# Patient Record
Sex: Female | Born: 1975 | Race: White | Hispanic: No | Marital: Married
Health system: Southern US, Community
[De-identification: ages and names within clinical notes are randomized; demographics above are authoritative.]

## PROBLEM LIST (undated history)

## (undated) DIAGNOSIS — Q969 Turner's syndrome, unspecified: Secondary | ICD-10-CM

## (undated) DIAGNOSIS — H8109 Meniere's disease, unspecified ear: Secondary | ICD-10-CM

---

## 2015-08-27 ENCOUNTER — Encounter (HOSPITAL_COMMUNITY): Payer: Self-pay | Admitting: Emergency Medicine

## 2015-08-27 ENCOUNTER — Emergency Department (HOSPITAL_COMMUNITY)
Admission: EM | Admit: 2015-08-27 | Discharge: 2015-08-27 | Disposition: A | Discharge status: Home / Self Care | Payer: Worker's Compensation | Attending: Emergency Medicine | Admitting: Emergency Medicine

## 2015-08-27 DIAGNOSIS — Y9289 Other specified places as the place of occurrence of the external cause: Secondary | ICD-10-CM | POA: Insufficient documentation

## 2015-08-27 DIAGNOSIS — S0990XA Unspecified injury of head, initial encounter: Secondary | ICD-10-CM | POA: Diagnosis present

## 2015-08-27 DIAGNOSIS — Y998 Other external cause status: Secondary | ICD-10-CM | POA: Diagnosis not present

## 2015-08-27 DIAGNOSIS — W000XXA Fall on same level due to ice and snow, initial encounter: Secondary | ICD-10-CM | POA: Insufficient documentation

## 2015-08-27 DIAGNOSIS — Q969 Turner's syndrome, unspecified: Secondary | ICD-10-CM | POA: Insufficient documentation

## 2015-08-27 DIAGNOSIS — Y9302 Activity, running: Secondary | ICD-10-CM | POA: Insufficient documentation

## 2015-08-27 DIAGNOSIS — S0181XA Laceration without foreign body of other part of head, initial encounter: Secondary | ICD-10-CM | POA: Diagnosis not present

## 2015-08-27 DIAGNOSIS — Z8669 Personal history of other diseases of the nervous system and sense organs: Secondary | ICD-10-CM | POA: Diagnosis not present

## 2015-08-27 DIAGNOSIS — Z79899 Other long term (current) drug therapy: Secondary | ICD-10-CM | POA: Insufficient documentation

## 2015-08-27 DIAGNOSIS — W19XXXA Unspecified fall, initial encounter: Secondary | ICD-10-CM

## 2015-08-27 HISTORY — DX: Meniere's disease, unspecified ear: H81.09

## 2015-08-27 HISTORY — DX: Turner's syndrome, unspecified: Q96.9

## 2015-08-27 MED ORDER — LIDOCAINE HCL (PF) 1 % IJ SOLN
10.0000 mL | Freq: Once | INTRAMUSCULAR | Status: AC
  Administered 2015-08-27: 10 mL

## 2015-08-27 NOTE — Discharge Instructions (Signed)
Facial Laceration ° A facial laceration is a cut on the face. These injuries can be painful and cause bleeding. Lacerations usually heal quickly, but they need special care to reduce scarring. °DIAGNOSIS  °Your health care provider will take a medical history, ask for details about how the injury occurred, and examine the wound to determine how deep the cut is. °TREATMENT  °Some facial lacerations may not require closure. Others may not be able to be closed because of an increased risk of infection. The risk of infection and the chance for successful closure will depend on various factors, including the amount of time since the injury occurred. °The wound may be cleaned to help prevent infection. If closure is appropriate, pain medicines may be given if needed. Your health care provider will use stitches (sutures), wound glue (adhesive), or skin adhesive strips to repair the laceration. These tools bring the skin edges together to allow for faster healing and a better cosmetic outcome. If needed, you may also be given a tetanus shot. °HOME CARE INSTRUCTIONS °· Only take over-the-counter or prescription medicines as directed by your health care provider. °· Follow your health care provider's instructions for wound care. These instructions will vary depending on the technique used for closing the wound. °For Sutures: °· Keep the wound clean and dry.   °· If you were given a bandage (dressing), you should change it at least once a day. Also change the dressing if it becomes wet or dirty, or as directed by your health care provider.   °· Wash the wound with soap and water 2 times a day. Rinse the wound off with water to remove all soap. Pat the wound dry with a clean towel.   °· After cleaning, apply a thin layer of the antibiotic ointment recommended by your health care provider. This will help prevent infection and keep the dressing from sticking.   °· You may shower as usual after the first 24 hours. Do not soak the  wound in water until the sutures are removed.   °· Get your sutures removed as directed by your health care provider. With facial lacerations, sutures should usually be taken out after 4-5 days to avoid stitch marks.   °· Wait a few days after your sutures are removed before applying any makeup. °For Skin Adhesive Strips: °· Keep the wound clean and dry.   °· Do not get the skin adhesive strips wet. You may bathe carefully, using caution to keep the wound dry.   °· If the wound gets wet, pat it dry with a clean towel.   °· Skin adhesive strips will fall off on their own. You may trim the strips as the wound heals. Do not remove skin adhesive strips that are still stuck to the wound. They will fall off in time.   °For Wound Adhesive: °· You may briefly wet your wound in the shower or bath. Do not soak or scrub the wound. Do not swim. Avoid periods of heavy sweating until the skin adhesive has fallen off on its own. After showering or bathing, gently pat the wound dry with a clean towel.   °· Do not apply liquid medicine, cream medicine, ointment medicine, or makeup to your wound while the skin adhesive is in place. This may loosen the film before your wound is healed.   °· If a dressing is placed over the wound, be careful not to apply tape directly over the skin adhesive. This may cause the adhesive to be pulled off before the wound is healed.   °· Avoid   prolonged exposure to sunlight or tanning lamps while the skin adhesive is in place.  The skin adhesive will usually remain in place for 5-10 days, then naturally fall off the skin. Do not pick at the adhesive film.  After Healing: Once the wound has healed, cover the wound with sunscreen during the day for 1 full year. This can help minimize scarring. Exposure to ultraviolet light in the first year will darken the scar. It can take 1-2 years for the scar to lose its redness and to heal completely.  SEEK MEDICAL CARE IF:  You have a fever. SEEK IMMEDIATE  MEDICAL CARE IF: Wound Care Taking care of your wound properly can help to prevent pain and infection. It can also help your wound to heal more quickly.  HOW TO CARE FOR YOUR WOUND  Take or apply over-the-counter and prescription medicines only as told by your health care provider.  If you were prescribed antibiotic medicine, take or apply it as told by your health care provider. Do not stop using the antibiotic even if your condition improves.  Clean the wound each day or as told by your health care provider.  Wash the wound with mild soap and water.  Rinse the wound with water to remove all soap.  Pat the wound dry with a clean towel. Do not rub it.  There are many different ways to close and cover a wound. For example, a wound can be covered with stitches (sutures), skin glue, or adhesive strips. Follow instructions from your health care provider about:  How to take care of your wound.  When and how you should change your bandage (dressing).  When you should remove your dressing.  Removing whatever was used to close your wound.  Check your wound every day for signs of infection. Watch for:  Redness, swelling, or pain.  Fluid, blood, or pus.  Keep the dressing dry until your health care provider says it can be removed. Do not take baths, swim, use a hot tub, or do anything that would put your wound underwater until your health care provider approves.  Raise (elevate) the injured area above the level of your heart while you are sitting or lying down.  Do not scratch or pick at the wound.  Keep all follow-up visits as told by your health care provider. This is important. SEEK MEDICAL CARE IF:  You received a tetanus shot and you have swelling, severe pain, redness, or bleeding at the injection site.  You have a fever.  Your pain is not controlled with medicine.  You have increased redness, swelling, or pain at the site of your wound.  You have fluid, blood, or pus  coming from your wound.  You notice a bad smell coming from your wound or your dressing. SEEK IMMEDIATE MEDICAL CARE IF:  You have a red streak going away from your wound.   This information is not intended to replace advice given to you by your health care provider. Make sure you discuss any questions you have with your health care provider.   Document Released: 05/10/2008 Document Revised: 12/16/2014 Document Reviewed: 07/28/2014 Elsevier Interactive Patient Education 2016 [employer].   You have redness, pain, or swelling around the wound.   You see ayellowish-white fluid (pus) coming from the wound.    This information is not intended to replace advice given to you by your health care provider. Make sure you discuss any questions you have with your health care provider.  Document Released: 09/08/2004 Document Revised: 08/22/2014 Document Reviewed: 03/14/2013 Elsevier Interactive Patient Education [employer]

## 2015-08-27 NOTE — ED Notes (Signed)
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 Pt states after fall she felt numbness to her fingers which has since resolved.

## 2015-08-27 NOTE — ED Provider Notes (Signed)
CSN: [id number]     Arrival date & time 08/27/15  1305 History  By signing my name below, I, Kimberly Strong, attest that this documentation has been prepared under the direction and in the presence of Marlon Peliffany Kinda Pottle, PA-C Electronically Signed: Charline BillsEssence Strong, ED Scribe 08/27/2015 at 3:30 PM.   Chief Complaint  Patient presents with  . Fall  . Head Laceration   The history is provided by the patient. No language interpreter was used.   HPI Comments: Kimberly Strong is a 40 y.o. female, with a h/o Turner's Syndrome, who presents to the Emergency Department complaining of a fall that occurred approximately 3 hours ago. Pt states that she was running after a piece of confidential paper when she tripped and fell on ice, striking her right eyebrow on the sidewalk. No head injury or LOC, no neck pain. Bleeding is controlled. Pt reports secondary mild right elbow pain that she also injured during the fall but says it doesn't hurt that bad. She denies neck pain and any other symptoms. Pt denies anticoagulant and antiplatelet use. Allergy to Aspirin and Sulfa antibiotics. She is not on any blood thinners.  Past Medical History  Diagnosis Date  . Turner syndrome   . Meniere disease    History reviewed. No pertinent past surgical history. No family history on file. Social History  Substance Use Topics  . Smoking status: None  . Smokeless tobacco: None  . Alcohol Use: Yes   OB History    No data available     Review of Systems  Musculoskeletal: Positive for arthralgias. Negative for neck pain.  Skin: Positive for wound.  All other systems reviewed and are negative.  Allergies  Aspirin and Sulfa antibiotics  Home Medications   Prior to Admission medications   Medication Sig Start Date End Date Taking? Authorizing Provider  PREMPHASE 0.625-5 MG TABS tablet Take 1 tablet by mouth daily. 08/15/15  Yes Historical Provider, MD  triamterene-hydrochlorothiazide (DYAZIDE) 37.5-25 MG capsule Take 1  capsule by mouth daily. 08/08/15  Yes Historical Provider, MD   BP 144/100 mmHg  Pulse 106  Temp(Src) 98.4 F (36.9 C) (Oral)  Resp 16  SpO2 99% Physical Exam  Constitutional: She is oriented to person, place, and time. She appears well-developed and well-nourished. No distress.  HENT:  Head: Normocephalic. Head is with abrasion, with contusion and with laceration. Head is without raccoon's eyes, without Battle's sign, without right periorbital erythema and without left periorbital erythema. Hair is normal.    Right Ear: Tympanic membrane and ear canal normal.  Left Ear: Tympanic membrane and ear canal normal.  Nose: Nose normal. Right sinus exhibits no maxillary sinus tenderness and no frontal sinus tenderness. Left sinus exhibits no maxillary sinus tenderness and no frontal sinus tenderness.  Mouth/Throat: Uvula is midline, oropharynx is clear and moist and mucous membranes are normal.  Eyes: Conjunctivae and EOM are normal.  Neck: Full passive range of motion without pain. Neck supple. No spinous process tenderness and no muscular tenderness present. No tracheal deviation present.  Cardiovascular: Normal rate.   Pulmonary/Chest: Effort normal. No respiratory distress.  Musculoskeletal: Normal range of motion.  Neurological: She is alert and oriented to person, place, and time.  Skin: Skin is warm and dry.  Psychiatric: She has a normal mood and affect. Her behavior is normal.  Nursing note and vitals reviewed.  ED Course  Procedures (including critical care time) DIAGNOSTIC STUDIES: Oxygen Saturation is 99% on RA, noramal by my interpretation.  COORDINATION OF CARE: 3:00 PM-Discussed treatment plan which includes sutures with pt at bedside and pt agreed to plan.   LACERATION REPAIR PROCEDURE NOTE The patient's identification was confirmed and consent was obtained. This procedure was performed by Marlon Pel, PA-C at 3:05 PM. Site: R forehead Sterile procedures  observed Anesthetic used (type and amt): 2 cc of 1% lidocaine  Suture type/size: 5-0 prolene Length: 2 cm # of Sutures: 5 Technique: simple interrupted  Complexity: complex Antibiotic ointment applied Tetanus UTD Site anesthetized, irrigated with NS, explored without evidence of foreign body, wound well approximated, site covered with dry, sterile Dressing. Patient tolerated procedure well without complications. Instructions for care discussed verbally and patient provided with additional written instructions for homecare and f/u.  Labs Review Labs Reviewed - No data to display  Imaging Review No results found.   EKG Interpretation None      MDM   Final diagnoses:  Fall, initial encounter  Facial laceration, initial encounter    Tetanus status is unknown but pt prefers to speak with PCP and get updated in primary office if she needs update.. Laceration occurred < 12 hours prior to repair. Discussed laceration care with pt and answered questions. Pt to f-u for suture removal in 7 days and wound check sooner should there be signs of dehiscence or infection. Pt is hemodynamically stable with no complaints prior to dc.    I personally performed the services described in this documentation, which was scribed in my presence. The recorded information has been reviewed and is accurate.   Marlon Pel, PA-C 08/27/15 1542  Alvira Monday, MD 08/29/15 1450

## 2016-08-23 ENCOUNTER — Other Ambulatory Visit: Payer: Self-pay | Admitting: Cardiology

## 2016-08-23 DIAGNOSIS — Q969 Turner's syndrome, unspecified: Secondary | ICD-10-CM

## 2016-09-27 ENCOUNTER — Ambulatory Visit
Admission: RE | Admit: 2016-09-27 | Discharge: 2016-09-27 | Disposition: A | Discharge status: Home / Self Care | Payer: PRIVATE HEALTH INSURANCE | Source: Ambulatory Visit | Attending: Cardiology | Admitting: Cardiology

## 2016-09-27 DIAGNOSIS — Q969 Turner's syndrome, unspecified: Secondary | ICD-10-CM

## 2016-09-27 MED ORDER — GADOBENATE DIMEGLUMINE 529 MG/ML IV SOLN
10.0000 mL | Freq: Once | INTRAVENOUS | Status: AC | PRN
  Administered 2016-09-27: 10 mL via INTRAVENOUS

## 2017-01-20 ENCOUNTER — Other Ambulatory Visit: Payer: Self-pay | Admitting: Obstetrics and Gynecology

## 2017-01-20 DIAGNOSIS — N6310 Unspecified lump in the right breast, unspecified quadrant: Secondary | ICD-10-CM

## 2017-01-24 ENCOUNTER — Ambulatory Visit
Admission: RE | Admit: 2017-01-24 | Discharge: 2017-01-24 | Disposition: A | Discharge status: Home / Self Care | Payer: PRIVATE HEALTH INSURANCE | Source: Ambulatory Visit | Attending: Obstetrics and Gynecology | Admitting: Obstetrics and Gynecology

## 2017-01-24 DIAGNOSIS — N6310 Unspecified lump in the right breast, unspecified quadrant: Secondary | ICD-10-CM

## 2017-05-07 IMAGING — MR MR MRA CHEST W/ OR W/O CM
10 series · 16 of 16 positions shown · IV contrast (multihance)
Comparison: CT neck 09/07/2015

CLINICAL DATA: Turner syndrome.

EXAM:
MRA CHEST WITH OR WITHOUT CONTRAST
TECHNIQUE: Angiographic images of the chest were obtained using MRA technique
with intravenous contrast.
CONTRAST:  10mL MULTIHANCE GADOBENATE DIMEGLUMINE 529 MG/ML IV SOLN

[Series 2: bSSFP · axial · 5.0mm · 0.74mm/px · z∈[+17,+237]mm · 2 of 45 slices shown (1 of 2)]
[im 1/45]
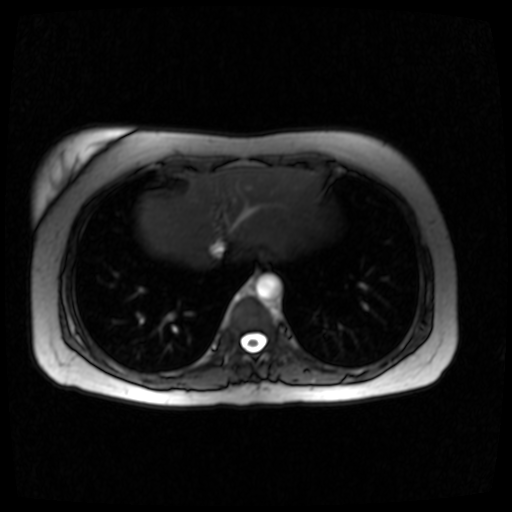
[im 45/45]
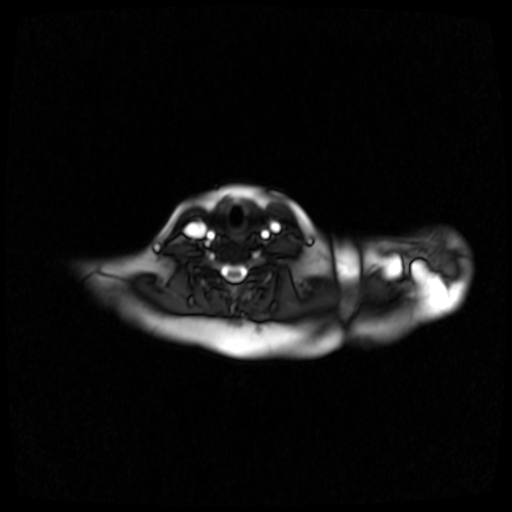

[Series 3: axial flash · axial · 5.0mm · 0.70mm/px · z∈[+17,+237]mm · 2 of 45 slices shown]
[im 1/45]
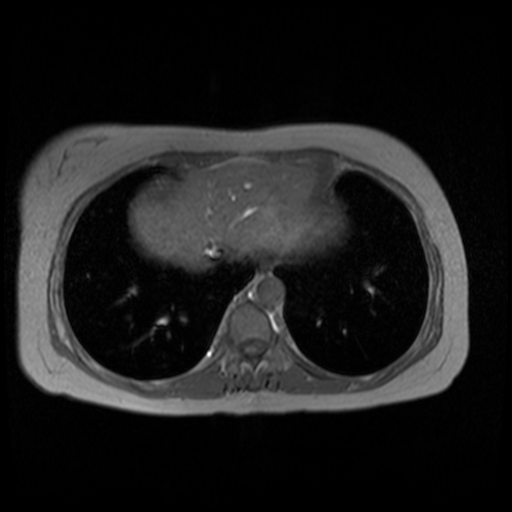
[im 45/45]
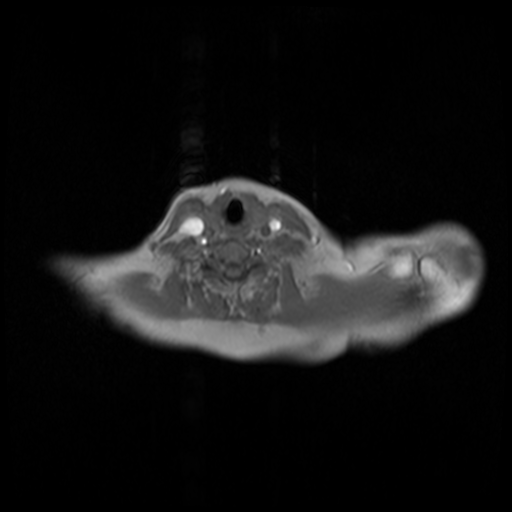

[Series 4: axial haste · axial · 5.0mm · 0.74mm/px · 1 of 45 slices shown]
[im 1/45]
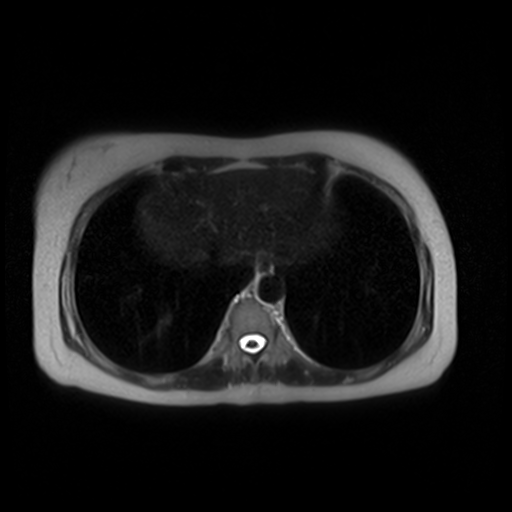

[Series 5: T1 dynamic · axial · non-contrast · 2.5mm · 0.78mm/px · z∈[-4,+214]mm · 2 of 88 slices shown]
[im 1/88]
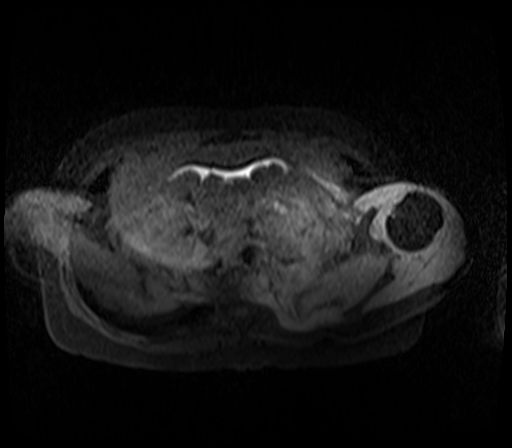
[im 88/88]
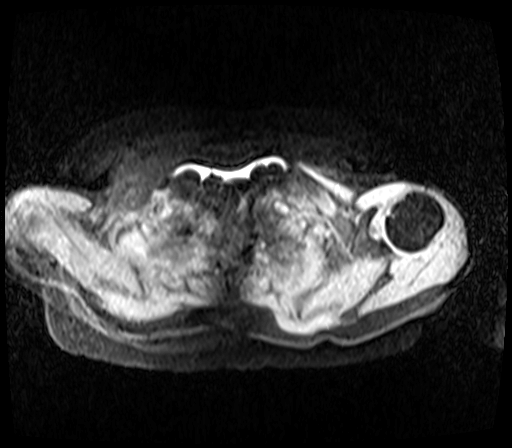

[Series 9: bSSFP · sagittal · 4.0mm · 0.68mm/px · 1 of 30 slices shown (2 of 2)]
[im 1/30]
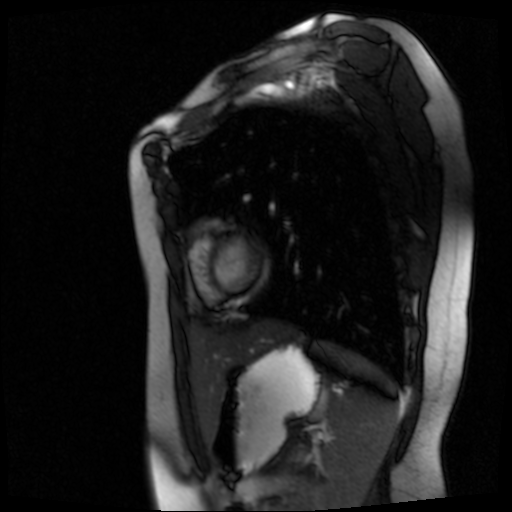

[Series 10: fl3d candy cane_tt=1.0s · sagittal · 1.5mm · 1.12mm/px · 2 of 80 slices shown]
[im 1/80]
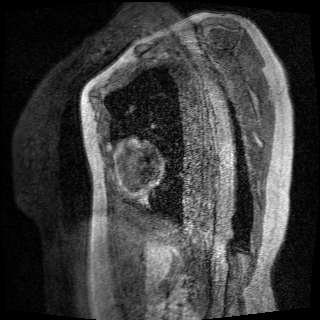
[im 80/80]
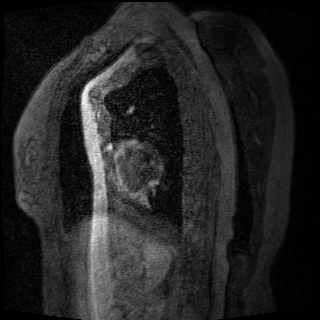

[Series 12: fl3d_cor candy cane_tt=1.0s · sagittal · 1.5mm · 1.12mm/px · 2 of 80 slices shown]
[im 1/80]
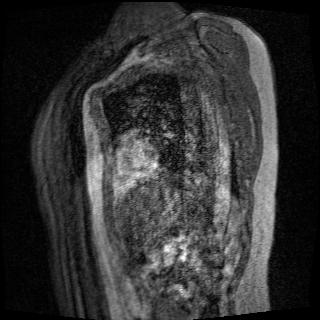
[im 80/80]
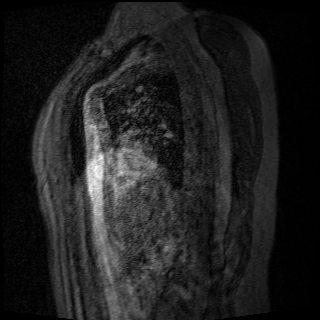

[Series 13: post axial flash · axial · 5.0mm · 0.70mm/px · 1 of 45 slices shown]
[im 1/45]
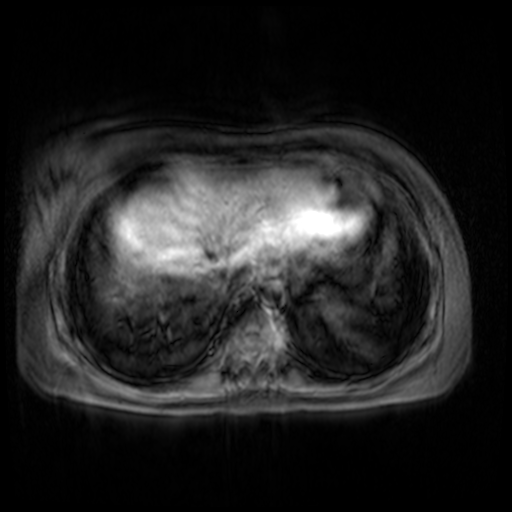

[Series 14: post flash candy · sagittal · 4.0mm · 0.70mm/px · 1 of 30 slices shown]
[im 1/30]
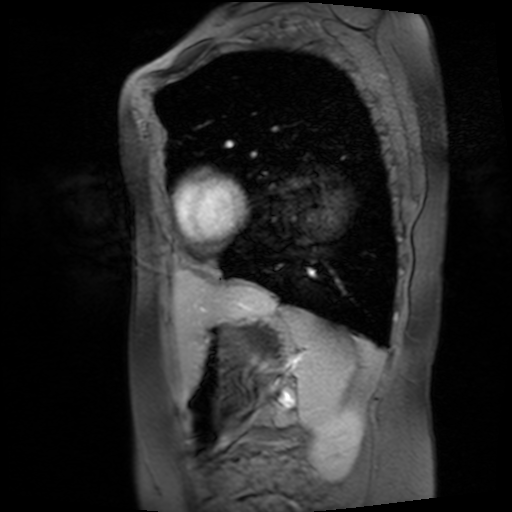

[Series 15: T1 dynamic post-contrast · axial · 2.5mm · 0.78mm/px · z∈[-4,+214]mm · 2 of 88 slices shown]
[im 1/88]
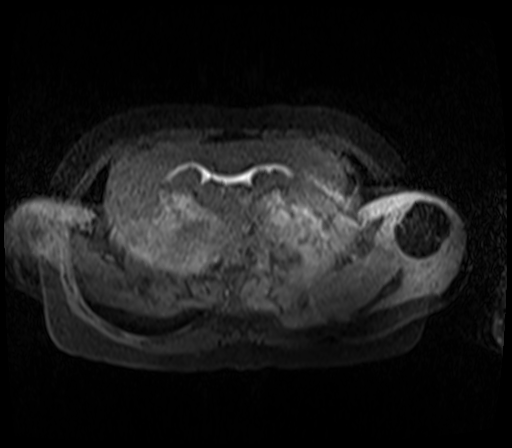
[im 88/88]
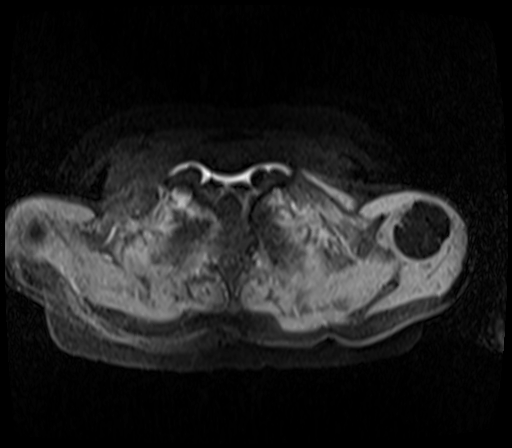

[16 of 16 positions shown; findings below may reference images not displayed]

FINDINGS: VASCULAR

Aorta: Ascending aorta normal in caliber. No dissection. Left-sided
aortic arch. Right brachiocephalic and left common carotid artery
origins widely patent. There is a broad diverticulum from the distal
arch at the left subclavian artery origin, measured up to 2 cm
diameter, tapering to normal caliber proximal to the origin of the
left vertebral artery. The distal arch beyond the subclavian origin
tapers to a diameter of 16 mm, widening to a diameter of 2.7 cm in
the proximal descending segment, unremarkable distally. No enlarged
intercostal arteries.

Heart: Normal size. No pericardial effusion.

Pulmonary Arteries:  Central pulmonary arteries unremarkable.

Other: None

NON-VASCULAR

Spinal cord: Negative limited evaluation

Brachial plexus: Negative

Muscles and tendons: Negative

Bones: Negative

Joints: Negative
IMPRESSION: VASCULAR

1. 2 cm diverticulum at the left subclavian artery origin, tapering
to normal caliber proximal to the left vertebral artery origin.
2. Mild distal aortic arch coarctation to minimal diameter 16 mm, of
possible hemodynamic significance.
3. Negative for aneurysm or dissection.

NON-VASCULAR

1. Negative

## 2019-05-20 ENCOUNTER — Other Ambulatory Visit: Payer: Self-pay | Admitting: Internal Medicine

## 2019-05-20 DIAGNOSIS — Z1231 Encounter for screening mammogram for malignant neoplasm of breast: Secondary | ICD-10-CM

## 2019-11-01 ENCOUNTER — Ambulatory Visit: Payer: PRIVATE HEALTH INSURANCE | Attending: Internal Medicine

## 2019-11-01 DIAGNOSIS — Z23 Encounter for immunization: Secondary | ICD-10-CM

## 2019-11-01 NOTE — Progress Notes (Signed)
   Covid-19 Vaccination Clinic  Name:  Kimberly Strong    MRN: [id number] DOB: [birth year]  11/01/2019  Kimberly Strong was observed post Covid-19 immunization for 15 minutes without incident. She was provided with Vaccine Information Sheet and instruction to access the V-Safe system.   Kimberly Strong was instructed to call 911 with any severe reactions post vaccine: Marland Kitchen Difficulty breathing  . Swelling of face and throat  . A fast heartbeat  . A bad rash all over body  . Dizziness and weakness   Immunizations Administered    Name Date Dose VIS Date Route   Pfizer COVID-19 Vaccine 11/01/2019  2:18 PM 0.3 mL 07/26/2019 Intramuscular   Manufacturer: [employer], [employer]   Lot: [id number]   NDC: [address]-8

## 2019-11-27 ENCOUNTER — Ambulatory Visit: Payer: PRIVATE HEALTH INSURANCE | Attending: Internal Medicine

## 2019-11-27 DIAGNOSIS — Z23 Encounter for immunization: Secondary | ICD-10-CM

## 2019-11-27 NOTE — Progress Notes (Signed)
   Covid-19 Vaccination Clinic  Name:  Kimberly Strong    MRN: [id number] DOB: [birth year]  11/27/2019  Kimberly Strong was observed post Covid-19 immunization for 15 minutes without incident. She was provided with Vaccine Information Sheet and instruction to access the V-Safe system.   Kimberly Strong was instructed to call 911 with any severe reactions post vaccine: Marland Kitchen Difficulty breathing  . Swelling of face and throat  . A fast heartbeat  . A bad rash all over body  . Dizziness and weakness   Immunizations Administered    Name Date Dose VIS Date Route   Pfizer COVID-19 Vaccine 11/27/2019  8:42 AM 0.3 mL 07/26/2019 Intramuscular   Manufacturer: [employer], [employer]   Lot: [id number]   NDC: [address]-0
